# Patient Record
Sex: Female | Born: 1935 | Race: White | Hispanic: No | Marital: Single | Smoking: Never smoker
Health system: Southern US, Community
[De-identification: ages and names within clinical notes are randomized; demographics above are authoritative.]

## PROBLEM LIST (undated history)

## (undated) DIAGNOSIS — M1990 Unspecified osteoarthritis, unspecified site: Secondary | ICD-10-CM

## (undated) DIAGNOSIS — K635 Polyp of colon: Secondary | ICD-10-CM

## (undated) DIAGNOSIS — K219 Gastro-esophageal reflux disease without esophagitis: Secondary | ICD-10-CM

## (undated) HISTORY — PX: ABDOMINAL HYSTERECTOMY: SHX81

## (undated) HISTORY — DX: Polyp of colon: K63.5

## (undated) HISTORY — DX: Unspecified osteoarthritis, unspecified site: M19.90

## (undated) HISTORY — PX: TONSILLECTOMY: SUR1361

---

## 2012-10-09 ENCOUNTER — Encounter (HOSPITAL_BASED_OUTPATIENT_CLINIC_OR_DEPARTMENT_OTHER): Payer: Self-pay | Admitting: *Deleted

## 2012-10-09 ENCOUNTER — Emergency Department (HOSPITAL_BASED_OUTPATIENT_CLINIC_OR_DEPARTMENT_OTHER): Payer: Medicare Other

## 2012-10-09 ENCOUNTER — Emergency Department (HOSPITAL_BASED_OUTPATIENT_CLINIC_OR_DEPARTMENT_OTHER)
Admission: EM | Admit: 2012-10-09 | Discharge: 2012-10-09 | Disposition: A | Discharge status: Home / Self Care | Payer: Medicare Other | Attending: Emergency Medicine | Admitting: Emergency Medicine

## 2012-10-09 DIAGNOSIS — Y939 Activity, unspecified: Secondary | ICD-10-CM | POA: Insufficient documentation

## 2012-10-09 DIAGNOSIS — S62319A Displaced fracture of base of unspecified metacarpal bone, initial encounter for closed fracture: Secondary | ICD-10-CM | POA: Insufficient documentation

## 2012-10-09 DIAGNOSIS — S0990XA Unspecified injury of head, initial encounter: Secondary | ICD-10-CM

## 2012-10-09 DIAGNOSIS — Z23 Encounter for immunization: Secondary | ICD-10-CM | POA: Insufficient documentation

## 2012-10-09 DIAGNOSIS — Y9289 Other specified places as the place of occurrence of the external cause: Secondary | ICD-10-CM | POA: Insufficient documentation

## 2012-10-09 DIAGNOSIS — Z880 Allergy status to penicillin: Secondary | ICD-10-CM | POA: Insufficient documentation

## 2012-10-09 DIAGNOSIS — S8000XA Contusion of unspecified knee, initial encounter: Secondary | ICD-10-CM | POA: Insufficient documentation

## 2012-10-09 DIAGNOSIS — S62309A Unspecified fracture of unspecified metacarpal bone, initial encounter for closed fracture: Secondary | ICD-10-CM

## 2012-10-09 DIAGNOSIS — S022XXA Fracture of nasal bones, initial encounter for closed fracture: Secondary | ICD-10-CM

## 2012-10-09 DIAGNOSIS — S0120XA Unspecified open wound of nose, initial encounter: Secondary | ICD-10-CM | POA: Insufficient documentation

## 2012-10-09 DIAGNOSIS — S0121XA Laceration without foreign body of nose, initial encounter: Secondary | ICD-10-CM

## 2012-10-09 DIAGNOSIS — S8002XA Contusion of left knee, initial encounter: Secondary | ICD-10-CM

## 2012-10-09 DIAGNOSIS — W19XXXA Unspecified fall, initial encounter: Secondary | ICD-10-CM

## 2012-10-09 DIAGNOSIS — W010XXA Fall on same level from slipping, tripping and stumbling without subsequent striking against object, initial encounter: Secondary | ICD-10-CM | POA: Insufficient documentation

## 2012-10-09 HISTORY — DX: Gastro-esophageal reflux disease without esophagitis: K21.9

## 2012-10-09 MED ORDER — TRAMADOL HCL 50 MG PO TABS: 50.0000 mg | ORAL_TABLET | Freq: Four times a day (QID) | ORAL | Status: DC | PRN

## 2012-10-09 MED ORDER — ONDANSETRON 4 MG PO TBDP: 4.0000 mg | ORAL_TABLET | Freq: Once | ORAL | Status: AC

## 2012-10-09 MED ORDER — ONDANSETRON 4 MG PO TBDP
ORAL_TABLET | ORAL | Status: AC
  Administered 2012-10-09: 4 mg via ORAL
  Filled 2012-10-09: qty 1

## 2012-10-09 MED ORDER — TETANUS-DIPHTH-ACELL PERTUSSIS 5-2.5-18.5 LF-MCG/0.5 IM SUSP
0.5000 mL | Freq: Once | INTRAMUSCULAR | Status: AC
  Administered 2012-10-09: 0.5 mL via INTRAMUSCULAR
  Filled 2012-10-09: qty 0.5

## 2012-10-09 NOTE — ED Notes (Signed)
Patient states she tripped on cub and fell. Laceration to nose c/o R hand/wrist & R knee pain

## 2012-10-09 NOTE — ED Provider Notes (Signed)
Wound management per request of Dr. Bernette Mayers.  Pt has a small cut to bridge of nose, not deep enough for suture.  No obvious septal hematoma.  Wound were cleansed, clot powder apply for hemostasis, and sterile strip applied.  Pt tolerates well.  [name], PA-C 10/09/12 1300

## 2012-10-09 NOTE — ED Provider Notes (Signed)
Medical screening examination/treatment/procedure(s) were conducted as a shared visit with non-physician practitioner(s) and myself.  I personally evaluated the patient during the encounter   Charles B. Bernette Mayers, MD 10/09/12 1409

## 2012-10-09 NOTE — ED Provider Notes (Signed)
CSN: [id number]     Arrival date & time 10/09/12  1027 History     First MD Initiated Contact with Patient 10/09/12 1029     Chief Complaint  Patient presents with  . Fall   (Consider location/radiation/quality/duration/timing/severity/associated sxs/prior Treatment) Patient is a 79 y.o. female presenting with fall.  Fall   Pt reports she tripped and fell at the [employer] just prior to arrival, hitting her face. She denies LOC, complaining of moderate pain to nose where she sustained a laceration as well as R hand and L knee. Tetanus is unknown. Not taking any blood thinners.  No past medical history on file. No past surgical history on file. No family history on file. History  Substance Use Topics  . Smoking status: Not on file  . Smokeless tobacco: Not on file  . Alcohol Use: Not on file   OB History   No data available     Review of Systems All other systems reviewed and are negative except as noted in HPI.   Allergies  Penicillins  Home Medications   Current Outpatient Rx  Name  Route  Sig  Dispense  Refill  . Omeprazole (PRILOSEC PO)   Oral   Take by mouth.          There were no vitals taken for this visit. Physical Exam  Nursing note and vitals reviewed. Constitutional: She is oriented to person, place, and time. She appears well-developed and well-nourished.  HENT:  Head: Normocephalic.  Laceration to bridge of nose; abrasion to R forehead  Eyes: EOM are normal. Pupils are equal, round, and reactive to light.  Neck: Normal range of motion. Neck supple.  Cardiovascular: Normal rate, normal heart sounds and intact distal pulses.   Pulmonary/Chest: Effort normal and breath sounds normal.  Abdominal: Bowel sounds are normal. She exhibits no distension. There is no tenderness.  Musculoskeletal: Normal range of motion. She exhibits no edema. Tenderness: tender to R hand and L knee.  Neurological: She is alert and oriented to person, place, and time.  She has normal strength. No cranial nerve deficit or sensory deficit.  Skin: Skin is warm and dry. No rash noted.  abrasion to R hand and bilateral knee  Psychiatric: She has a normal mood and affect.    ED Course   Procedures (including critical care time)  Labs Reviewed - No data to display Ct Head Wo Contrast  10/09/2012   *RADIOLOGY REPORT*  Clinical Data:  Status post fall with facial injury.  Right supraorbital hematoma and nasal pain.  CT HEAD WITHOUT CONTRAST CT MAXILLOFACIAL WITHOUT CONTRAST  Technique:  Multidetector CT imaging of the head and maxillofacial structures were performed using the standard protocol without intravenous contrast. Multiplanar CT image reconstructions of the maxillofacial structures were also generated.  Comparison:   None.  CT HEAD  Findings: There is soft tissue swelling in the right frontal scalp. There is no evidence of acute intracranial hemorrhage, mass lesion, brain edema or extra-axial fluid collection.  The ventricles and subarachnoid spaces are appropriately sized for age.  There is no CT evidence of acute cortical infarction.  The visualized paranasal sinuses, mastoid air cells and middle ears are clear. Facial findings are described below.  There is no evidence of calvarial fracture.  IMPRESSION: No acute intracranial or calvarial findings.  CT MAXILLOFACIAL  Findings:   There are nondisplaced bilateral nasal bone fractures. No other facial fractures are identified.  The mandible and temporomandibular joints are intact.  The  paranasal sinuses are clear without air fluid levels.  No orbital hematoma is identified.  There is a focal supraorbital hematoma in the right frontal scalp.  Degenerative changes are noted within the upper cervical spine.  IMPRESSION:  1.  Bilateral nondisplaced nasal bone fractures. 2.  No other facial fractures are identified. 3.  Right supraorbital soft tissue swelling/hematoma.   Original Report Authenticated By: Carey Bullocks,  M.D.   Dg Knee Complete 4 Views Left  10/09/2012   *RADIOLOGY REPORT*  Clinical Data: Fall  LEFT KNEE - COMPLETE 4+ VIEW  Comparison: None.  Findings: No acute fracture and no dislocation.  IMPRESSION: No acute bony pathology.   Original Report Authenticated By: Jolaine Click, M.D.   Dg Hand Complete Right  10/09/2012   *RADIOLOGY REPORT*  Clinical Data: Injury  RIGHT HAND - COMPLETE 3+ VIEW  Comparison: None.  Findings: There is cortical step off along the lateral aspect of the base of the fifth metacarpal.  This can be seen on two views with lucency extending into the central bone.  Nondisplaced fracture is not excluded.  Digits of the small finger are intact. Degenerative changes.  Osteopenia.  IMPRESSION: Nondisplaced fracture at the base of the fifth metacarpal is suspected.  Correlate clinically.   Original Report Authenticated By: Jolaine Click, M.D.   Ct Maxillofacial Wo Cm  10/09/2012   *RADIOLOGY REPORT*  Clinical Data:  Status post fall with facial injury.  Right supraorbital hematoma and nasal pain.  CT HEAD WITHOUT CONTRAST CT MAXILLOFACIAL WITHOUT CONTRAST  Technique:  Multidetector CT imaging of the head and maxillofacial structures were performed using the standard protocol without intravenous contrast. Multiplanar CT image reconstructions of the maxillofacial structures were also generated.  Comparison:   None.  CT HEAD  Findings: There is soft tissue swelling in the right frontal scalp. There is no evidence of acute intracranial hemorrhage, mass lesion, brain edema or extra-axial fluid collection.  The ventricles and subarachnoid spaces are appropriately sized for age.  There is no CT evidence of acute cortical infarction.  The visualized paranasal sinuses, mastoid air cells and middle ears are clear. Facial findings are described below.  There is no evidence of calvarial fracture.  IMPRESSION: No acute intracranial or calvarial findings.  CT MAXILLOFACIAL  Findings:   There are nondisplaced  bilateral nasal bone fractures. No other facial fractures are identified.  The mandible and temporomandibular joints are intact.  The paranasal sinuses are clear without air fluid levels.  No orbital hematoma is identified.  There is a focal supraorbital hematoma in the right frontal scalp.  Degenerative changes are noted within the upper cervical spine.  IMPRESSION:  1.  Bilateral nondisplaced nasal bone fractures. 2.  No other facial fractures are identified. 3.  Right supraorbital soft tissue swelling/hematoma.   Original Report Authenticated By: Carey Bullocks, M.D.   1. Fall, initial encounter   2. Head injury, initial encounter   3. Nasal bone fracture, closed, initial encounter   4. Laceration of nose, initial encounter   5. Metacarpal bone fracture, closed, initial encounter   6. Contusion of knee, left, initial encounter     MDM  Imaging as above. Ulnar gutter for hand fx with Hand followup. Wound repair per [name], PAC. ENT follow up as needed for nasal bone fx.   Giabella Duhart B. Bernette Mayers, MD 10/09/12 1310

## 2012-10-09 NOTE — ED Notes (Signed)
Patient ambulatory to restroom without difficulty and with stand-by assist from family member. Patient with steady gait

## 2012-10-09 NOTE — ED Notes (Signed)
The patient is in a gown for X-Ray. There is a wet gauze on the patient's nose to help with the bleeding. The bed is locked the rails are up and it is in the lowest position. The call light is within reach.

## 2012-10-19 ENCOUNTER — Ambulatory Visit: Payer: Self-pay | Admitting: Family Medicine

## 2012-10-29 ENCOUNTER — Ambulatory Visit (INDEPENDENT_AMBULATORY_CARE_PROVIDER_SITE_OTHER): Payer: Medicare Other | Admitting: Family Medicine

## 2012-10-29 ENCOUNTER — Encounter: Payer: Self-pay | Admitting: Family Medicine

## 2012-10-29 VITALS — BP 121/78 | HR 83 | Resp 16 | Ht 63.5 in | Wt 184.0 lb

## 2012-10-29 DIAGNOSIS — K219 Gastro-esophageal reflux disease without esophagitis: Secondary | ICD-10-CM

## 2012-10-29 DIAGNOSIS — R109 Unspecified abdominal pain: Secondary | ICD-10-CM

## 2012-10-29 MED ORDER — OMEPRAZOLE 20 MG PO CPDR: DELAYED_RELEASE_CAPSULE | ORAL | Status: DC

## 2012-10-29 NOTE — Progress Notes (Signed)
  Subjective:    Patient ID: Leslie Reynolds, female    DOB: 07/06/1927, 79 y.o.   MRN: [id number]  HPI  Leslie Reynolds is here today to establish care with our practice.  She was referred to [place] by her friend Quinn Axe).  She moved to [place] from [place], [place] back in May.  She previously received her care from a Dr. Hollie Beach with [facility] in [place].  She would like to discuss the issues listed below:    1)  Abdominal Tenderness:  She has accidentally fallen a couple of times recently and was seen at the [facility] ED.  In July, she fractured her nose and her right hand at the base of the 5th metacarpal.  Since her fall, she has had some tenderness in her abdomen.   2) GERD:  Her acid reflux is under control on her Prilosec 40 mg.  She needs a refill on it.    Review of Systems  Constitutional: Negative for activity change, fatigue and unexpected weight change.  HENT: Negative.   Eyes: Negative.   Respiratory: Negative for shortness of breath.   Cardiovascular: Negative for chest pain, palpitations and leg swelling.  Gastrointestinal: Positive for abdominal pain. Negative for diarrhea and constipation.       Tenderness  Endocrine: Negative.   Genitourinary: Negative for difficulty urinating.  Musculoskeletal: Negative.   Skin: Negative.   Neurological: Negative.   Hematological: Negative for adenopathy. Does not bruise/bleed easily.  Psychiatric/Behavioral: Negative for sleep disturbance and dysphoric mood. The patient is not nervous/anxious.     Past Medical History  Diagnosis Date  . Acid reflux   . Arthritis     Cervical  and Lumbar Spine     Family History  Problem Relation Age of Onset  . Hypertension Mother   . Heart disease Mother   . Cancer Brother     History   Social History Narrative   Marital Status: Single    Children:  None   Pets: None    Living Situation: Lives alone    Occupation:  Retired Government social research officer)    Education: Engineer, maintenance (IT)    Tobacco Use/Exposure:  None    Alcohol Use:  Occasional   Drug Use:  None   Diet:  Regular   Exercise:  None   Hobbies:  Story telling and playing games                  Objective:   Physical Exam  Vitals reviewed. Constitutional: She is oriented to person, place, and time.  Eyes: Conjunctivae are normal. No scleral icterus.  Neck: Neck supple. No thyromegaly present.  Cardiovascular: Normal rate, regular rhythm and normal heart sounds.   Pulmonary/Chest: Effort normal and breath sounds normal.  Abdominal: Soft. Bowel sounds are normal. She exhibits no distension and no mass. There is tenderness (Mild). There is no rebound and no guarding.  Musculoskeletal: She exhibits no edema and no tenderness.  Lymphadenopathy:    She has no cervical adenopathy.  Neurological: She is alert and oriented to person, place, and time.  Skin: Skin is warm and dry.  Psychiatric: She has a normal mood and affect. Her behavior is normal. Judgment and thought content normal.     Assessment & Plan:

## 2012-12-12 ENCOUNTER — Encounter: Payer: Self-pay | Admitting: Family Medicine

## 2012-12-12 DIAGNOSIS — R109 Unspecified abdominal pain: Secondary | ICD-10-CM | POA: Insufficient documentation

## 2012-12-12 DIAGNOSIS — K219 Gastro-esophageal reflux disease without esophagitis: Secondary | ICD-10-CM | POA: Insufficient documentation

## 2012-12-12 NOTE — Assessment & Plan Note (Signed)
She appears to have just pulled a muscle.  If she does not improve, we'll investigate this further.

## 2012-12-12 NOTE — Assessment & Plan Note (Addendum)
She will continue on Prilosec and was given a prescription for [facility].  She will take 1-2 of the 20 mg capsules.

## 2013-03-03 ENCOUNTER — Encounter (INDEPENDENT_AMBULATORY_CARE_PROVIDER_SITE_OTHER): Payer: Self-pay

## 2013-03-03 ENCOUNTER — Ambulatory Visit (INDEPENDENT_AMBULATORY_CARE_PROVIDER_SITE_OTHER): Payer: Medicare Other | Admitting: Family Medicine

## 2013-03-03 ENCOUNTER — Ambulatory Visit: Payer: Self-pay | Admitting: Family Medicine

## 2013-03-03 ENCOUNTER — Encounter: Payer: Self-pay | Admitting: Family Medicine

## 2013-03-03 VITALS — BP 133/74 | HR 82 | Resp 16 | Wt 194.0 lb

## 2013-03-03 DIAGNOSIS — R2689 Other abnormalities of gait and mobility: Secondary | ICD-10-CM

## 2013-03-03 DIAGNOSIS — R29818 Other symptoms and signs involving the nervous system: Secondary | ICD-10-CM

## 2013-03-03 DIAGNOSIS — M549 Dorsalgia, unspecified: Secondary | ICD-10-CM

## 2013-03-03 MED ORDER — CYCLOBENZAPRINE HCL 5 MG PO TABS: 5.0000 mg | ORAL_TABLET | Freq: Every day | ORAL | Status: DC

## 2013-03-03 MED ORDER — MELOXICAM 7.5 MG PO TABS: 7.5000 mg | ORAL_TABLET | Freq: Every day | ORAL | Status: DC

## 2013-03-03 NOTE — Progress Notes (Signed)
   Subjective:    Patient ID: Leslie Reynolds, female    DOB: 04/24/1927, 79 y.o.   MRN: [id number]  HPI  [place] is here today complaining of back pain.  Since July, 2014, she has fallen a couple of times.  She feels that her falls are caused by her lack of attention.  She has a nurse friend who has evaluated her home for safety and she feels that Nasia's home is safe.  Her last fall was 10 days ago and since this fall she has been having severe back pain. This pain radiates to her left leg and she has experienced some numbness as well.  She has taken Ibuprofen (200 mg) two pills every two hours which has eased her pain.     Review of Systems  Musculoskeletal: Positive for back pain.  Neurological: Positive for numbness.       Left thigh    Past Medical History  Diagnosis Date  . Acid reflux   . Arthritis     Cervical  and Lumbar Spine      Past Surgical History  Procedure Laterality Date  . Abdominal hysterectomy    . Tonsillectomy       History   Social History Narrative   Marital Status: Single    Children:  None   Pets: None    Living Situation: Lives alone    Occupation:  Retired Government social research officer)    Education: Engineer, maintenance (IT)    Tobacco Use/Exposure:  None    Alcohol Use:  Occasional   Drug Use:  None   Diet:  Regular   Exercise:  None   Hobbies:  Story telling and playing games                 Family History  Problem Relation Age of Onset  . Hypertension Mother   . Heart disease Mother   . Cancer Brother      Current Outpatient Prescriptions on File Prior to Visit  Medication Sig Dispense Refill  . omeprazole (PRILOSEC) 20 MG capsule Take 1-2 capsules per day  180 capsule  4  . [DISCONTINUED] Omeprazole (PRILOSEC PO) Take by mouth.       No current facility-administered medications on file prior to visit.     Allergies  Allergen Reactions  . Penicillins      Immunization History  Administered Date(s) Administered  . Tdap  10/09/2012      Objective:   Physical Exam  Constitutional: She appears well-nourished. She appears distressed.  Neck: Normal range of motion. Neck supple.  Cardiovascular: Normal rate, regular rhythm and normal heart sounds.   Pulmonary/Chest: Effort normal and breath sounds normal.  Musculoskeletal: She exhibits tenderness (Low Back Pain). She exhibits no edema.       Lumbar back: She exhibits decreased range of motion, tenderness and spasm. She exhibits no edema and no deformity.  Neurological: She has normal reflexes. She exhibits normal muscle tone. Coordination normal.  Skin: No rash noted.      Assessment & Plan:    Jamaiya was seen today for back pain.  Diagnoses and associated orders for this visit:  Backache - meloxicam (MOBIC) 7.5 MG tablet; Take 1 tablet (7.5 mg total) by mouth daily. - cyclobenzaprine (FLEXERIL) 5 MG tablet; Take 1 tablet (5 mg total) by mouth at bedtime.   Balance disorder - Ambulatory referral to Physical Therapy

## 2013-04-08 ENCOUNTER — Encounter: Payer: Medicare Other | Admitting: Family Medicine

## 2013-05-12 ENCOUNTER — Encounter: Payer: Medicare Other | Admitting: Family Medicine

## 2013-06-24 ENCOUNTER — Encounter: Payer: Self-pay | Admitting: Family Medicine

## 2013-06-24 ENCOUNTER — Ambulatory Visit (INDEPENDENT_AMBULATORY_CARE_PROVIDER_SITE_OTHER): Payer: Medicare Other | Admitting: Family Medicine

## 2013-06-24 VITALS — BP 137/74 | HR 96 | Resp 16 | Ht 63.5 in | Wt 183.0 lb

## 2013-06-24 DIAGNOSIS — K219 Gastro-esophageal reflux disease without esophagitis: Secondary | ICD-10-CM

## 2013-06-24 DIAGNOSIS — R5381 Other malaise: Secondary | ICD-10-CM

## 2013-06-24 DIAGNOSIS — Z0000 Encounter for general adult medical examination without abnormal findings: Secondary | ICD-10-CM

## 2013-06-24 DIAGNOSIS — R5383 Other fatigue: Secondary | ICD-10-CM

## 2013-06-24 DIAGNOSIS — R319 Hematuria, unspecified: Secondary | ICD-10-CM

## 2013-06-24 LAB — POCT URINALYSIS DIPSTICK
Bilirubin, UA: NEGATIVE
Glucose, UA: NEGATIVE
Ketones, UA: NEGATIVE
Leukocytes, UA: NEGATIVE
Nitrite, UA: NEGATIVE
Protein, UA: NEGATIVE
Spec Grav, UA: 1.015
Urobilinogen, UA: NEGATIVE
pH, UA: 6

## 2013-06-24 LAB — EKG 12-LEAD

## 2013-06-24 NOTE — Patient Instructions (Signed)
1)  Preventative Issues:    A) Confirm your vaccinations:   Tdap Pneumovax Prevnar 13 (New Pneumonia)  Zostavax   B)  I would recommend a colonoscopy 5 years from your last one.  You might also consider doing the stool cards in between your colonoscopy.  C)  Mammogram - Consider getting a digital mammogram (Ask your buddies where do they go).  Take Ibuprofen 800 mg and Tylenol 1000 mg prior to the mammogram.      Preventive Care for Adults, Female A healthy lifestyle and preventive care can promote health and wellness. Preventive health guidelines for women include the following key practices.  A routine yearly physical is a good way to check with your health care provider about your health and preventive screening. It is a chance to share any concerns and updates on your health and to receive a thorough exam.  Visit your dentist for a routine exam and preventive care every 6 months. Brush your teeth twice a day and floss once a day. Good oral hygiene prevents tooth decay and gum disease.  The frequency of eye exams is based on your age, health, family medical history, use of contact lenses, and other factors. Follow your health care provider's recommendations for frequency of eye exams.  Eat a healthy diet. Foods like vegetables, fruits, whole grains, low-fat dairy products, and lean protein foods contain the nutrients you need without too many calories. Decrease your intake of foods high in solid fats, added sugars, and salt. Eat the right amount of calories for you.Get information about a proper diet from your health care provider, if necessary.  Regular physical exercise is one of the most important things you can do for your health. Most adults should get at least 150 minutes of moderate-intensity exercise (any activity that increases your heart rate and causes you to sweat) each week. In addition, most adults need muscle-strengthening exercises on 2 or more days a week.  Maintain a  healthy weight. The body mass index (BMI) is a screening tool to identify possible weight problems. It provides an estimate of body fat based on height and weight. Your health care provider can find your BMI, and can help you achieve or maintain a healthy weight.For adults 20 years and older:  A BMI below 18.5 is considered underweight.  A BMI of 18.5 to 24.9 is normal.  A BMI of 25 to 29.9 is considered overweight.  A BMI of 30 and above is considered obese.  Maintain normal blood lipids and cholesterol levels by exercising and minimizing your intake of saturated fat. Eat a balanced diet with plenty of fruit and vegetables. Blood tests for lipids and cholesterol should begin at age 78 and be repeated every 5 years. If your lipid or cholesterol levels are high, you are over 50, or you are at high risk for heart disease, you may need your cholesterol levels checked more frequently.Ongoing high lipid and cholesterol levels should be treated with medicines if diet and exercise are not working.  If you smoke, find out from your health care provider how to quit. If you do not use tobacco, do not start.  Lung cancer screening is recommended for adults aged 14 80 years who are at high risk for developing lung cancer because of a history of smoking. A yearly low-dose CT scan of the lungs is recommended for people who have at least a 30-pack-year history of smoking and are a current smoker or have quit within the past 15  years. A pack year of smoking is smoking an average of 1 pack of cigarettes a day for 1 year (for example: 1 pack a day for 30 years or 2 packs a day for 15 years). Yearly screening should continue until the smoker has stopped smoking for at least 15 years. Yearly screening should be stopped for people who develop a health problem that would prevent them from having lung cancer treatment.  If you are pregnant, do not drink alcohol. If you are breastfeeding, be very cautious about drinking  alcohol. If you are not pregnant and choose to drink alcohol, do not have more than 1 drink per day. One drink is considered to be 12 ounces (355 mL) of beer, 5 ounces (148 mL) of wine, or 1.5 ounces (44 mL) of liquor.  Avoid use of street drugs. Do not share needles with anyone. Ask for help if you need support or instructions about stopping the use of drugs.  High blood pressure causes heart disease and increases the risk of stroke. Your blood pressure should be checked at least every 1 to 2 years. Ongoing high blood pressure should be treated with medicines if weight loss and exercise do not work.  If you are 94 78 years old, ask your health care provider if you should take aspirin to prevent strokes.  Diabetes screening involves taking a blood sample to check your fasting blood sugar level. This should be done once every 3 years, after age 96, if you are within normal weight and without risk factors for diabetes. Testing should be considered at a younger age or be carried out more frequently if you are overweight and have at least 1 risk factor for diabetes.  Breast cancer screening is essential preventive care for women. You should practice "breast self-awareness." This means understanding the normal appearance and feel of your breasts and may include breast self-examination. Any changes detected, no matter how small, should be reported to a health care provider. Women in their 72s and 30s should have a clinical breast exam (CBE) by a health care provider as part of a regular health exam every 1 to 3 years. After age 66, women should have a CBE every year. Starting at age 60, women should consider having a mammogram (breast X-ray test) every year. Women who have a family history of breast cancer should talk to their health care provider about genetic screening. Women at a high risk of breast cancer should talk to their health care providers about having an MRI and a mammogram every year.  Breast  cancer gene (BRCA)-related cancer risk assessment is recommended for women who have family members with BRCA-related cancers. BRCA-related cancers include breast, ovarian, tubal, and peritoneal cancers. Having family members with these cancers may be associated with an increased risk for harmful changes (mutations) in the breast cancer genes BRCA1 and BRCA2. Results of the assessment will determine the need for genetic counseling and BRCA1 and BRCA2 testing.  The Pap test is a screening test for cervical cancer. A Pap test can show cell changes on the cervix that might become cervical cancer if left untreated. A Pap test is a procedure in which cells are obtained and examined from the lower end of the uterus (cervix).  Women should have a Pap test starting at age 28.  Between ages 45 and 46, Pap tests should be repeated every 2 years.  Beginning at age 40, you should have a Pap test every 3 years as long as the  past 3 Pap tests have been normal.  Some women have medical problems that increase the chance of getting cervical cancer. Talk to your health care provider about these problems. It is especially important to talk to your health care provider if a new problem develops soon after your last Pap test. In these cases, your health care provider may recommend more frequent screening and Pap tests.  The above recommendations are the same for women who have or have not gotten the vaccine for human papillomavirus (HPV).  If you had a hysterectomy for a problem that was not cancer or a condition that could lead to cancer, then you no longer need Pap tests. Even if you no longer need a Pap test, a regular exam is a good idea to make sure no other problems are starting.  If you are between ages 4 and 22 years, and you have had normal Pap tests going back 10 years, you no longer need Pap tests. Even if you no longer need a Pap test, a regular exam is a good idea to make sure no other problems are  starting.  If you have had past treatment for cervical cancer or a condition that could lead to cancer, you need Pap tests and screening for cancer for at least 20 years after your treatment.  If Pap tests have been discontinued, risk factors (such as a new sexual partner) need to be reassessed to determine if screening should be resumed.  The HPV test is an additional test that may be used for cervical cancer screening. The HPV test looks for the virus that can cause the cell changes on the cervix. The cells collected during the Pap test can be tested for HPV. The HPV test could be used to screen women aged 45 years and older, and should be used in women of any age who have unclear Pap test results. After the age of 58, women should have HPV testing at the same frequency as a Pap test.  Colorectal cancer can be detected and often prevented. Most routine colorectal cancer screening begins at the age of 46 years and continues through age 67 years. However, your health care provider may recommend screening at an earlier age if you have risk factors for colon cancer. On a yearly basis, your health care provider may provide home test kits to check for hidden blood in the stool. Use of a small camera at the end of a tube, to directly examine the colon (sigmoidoscopy or colonoscopy), can detect the earliest forms of colorectal cancer. Talk to your health care provider about this at age 68, when routine screening begins. Direct exam of the colon should be repeated every 5 10 years through age 75 years, unless early forms of pre-cancerous polyps or small growths are found.  People who are at an increased risk for hepatitis B should be screened for this virus. You are considered at high risk for hepatitis B if:  You were born in a country where hepatitis B occurs often. Talk with your health care provider about which countries are considered high risk.  Your parents were born in a high-risk country and you  have not received a shot to protect against hepatitis B (hepatitis B vaccine).  You have HIV or AIDS.  You use needles to inject street drugs.  You live with, or have sex with, someone who has Hepatitis B.  You get hemodialysis treatment.  You take certain medicines for conditions like cancer, organ transplantation,  and autoimmune conditions.  Hepatitis C blood testing is recommended for all people born from [birth year] through 1963 and any individual with known risks for hepatitis C.  Practice safe sex. Use condoms and avoid high-risk sexual practices to reduce the spread of sexually transmitted infections (STIs). STIs include gonorrhea, chlamydia, syphilis, trichomonas, herpes, HPV, and human immunodeficiency virus (HIV). Herpes, HIV, and HPV are viral illnesses that have no cure. They can result in disability, cancer, and death. Sexually active women aged 17 years and younger should be checked for chlamydia. Older women with new or multiple partners should also be tested for chlamydia. Testing for other STIs is recommended if you are sexually active and at increased risk.  Osteoporosis is a disease in which the bones lose minerals and strength with aging. This can result in serious bone fractures or breaks. The risk of osteoporosis can be identified using a bone density scan. Women ages 30 years and over and women at risk for fractures or osteoporosis should discuss screening with their health care providers. Ask your health care provider whether you should take a calcium supplement or vitamin D to reduce the rate of osteoporosis.  Menopause can be associated with physical symptoms and risks. Hormone replacement therapy is available to decrease symptoms and risks. You should talk to your health care provider about whether hormone replacement therapy is right for you.  Use sunscreen. Apply sunscreen liberally and repeatedly throughout the day. You should seek shade when your shadow is shorter than  you. Protect yourself by wearing long sleeves, pants, a wide-brimmed hat, and sunglasses year round, whenever you are outdoors.  Once a month, do a whole body skin exam, using a mirror to look at the skin on your back. Tell your health care provider of new moles, moles that have irregular borders, moles that are larger than a pencil eraser, or moles that have changed in shape or color.  Stay current with required vaccines (immunizations).  Influenza vaccine. All adults should be immunized every year.  Tetanus, diphtheria, and acellular pertussis (Td, Tdap) vaccine. Pregnant women should receive 1 dose of Tdap vaccine during each pregnancy. The dose should be obtained regardless of the length of time since the last dose. Immunization is preferred during the 27th 36th week of gestation. An adult who has not previously received Tdap or who does not know her vaccine status should receive 1 dose of Tdap. This initial dose should be followed by tetanus and diphtheria toxoids (Td) booster doses every 10 years. Adults with an unknown or incomplete history of completing a 3-dose immunization series with Td-containing vaccines should begin or complete a primary immunization series including a Tdap dose. Adults should receive a Td booster every 10 years.  Varicella vaccine. An adult without evidence of immunity to varicella should receive 2 doses or a second dose if she has previously received 1 dose. Pregnant females who do not have evidence of immunity should receive the first dose after pregnancy. This first dose should be obtained before leaving the health care facility. The second dose should be obtained 4 8 weeks after the first dose.  Human papillomavirus (HPV) vaccine. Females aged 51 26 years who have not received the vaccine previously should obtain the 3-dose series. The vaccine is not recommended for use in pregnant females. However, pregnancy testing is not needed before receiving a dose. If a female  is found to be pregnant after receiving a dose, no treatment is needed. In that case, the remaining doses should  be delayed until after the pregnancy. Immunization is recommended for any person with an immunocompromised condition through the age of 58 years if she did not get any or all doses earlier. During the 3-dose series, the second dose should be obtained 4 8 weeks after the first dose. The third dose should be obtained 24 weeks after the first dose and 16 weeks after the second dose.  Zoster vaccine. One dose is recommended for adults aged 19 years or older unless certain conditions are present.  Measles, mumps, and rubella (MMR) vaccine. Adults born before [birth year] generally are considered immune to measles and mumps. Adults born in [birth year] or later should have 1 or more doses of MMR vaccine unless there is a contraindication to the vaccine or there is laboratory evidence of immunity to each of the three diseases. A routine second dose of MMR vaccine should be obtained at least 28 days after the first dose for students attending postsecondary schools, health care workers, or international travelers. People who received inactivated measles vaccine or an unknown type of measles vaccine during 1963 1967 should receive 2 doses of MMR vaccine. People who received inactivated mumps vaccine or an unknown type of mumps vaccine before 1979 and are at high risk for mumps infection should consider immunization with 2 doses of MMR vaccine. For females of childbearing age, rubella immunity should be determined. If there is no evidence of immunity, females who are not pregnant should be vaccinated. If there is no evidence of immunity, females who are pregnant should delay immunization until after pregnancy. Unvaccinated health care workers born before [birth year] who lack laboratory evidence of measles, mumps, or rubella immunity or laboratory confirmation of disease should consider measles and mumps immunization with 2 doses of  MMR vaccine or rubella immunization with 1 dose of MMR vaccine.  Pneumococcal 13-valent conjugate (PCV13) vaccine. When indicated, a person who is uncertain of her immunization history and has no record of immunization should receive the PCV13 vaccine. An adult aged 13 years or older who has certain medical conditions and has not been previously immunized should receive 1 dose of PCV13 vaccine. This PCV13 should be followed with a dose of pneumococcal polysaccharide (PPSV23) vaccine. The PPSV23 vaccine dose should be obtained at least 8 weeks after the dose of PCV13 vaccine. An adult aged 90 years or older who has certain medical conditions and previously received 1 or more doses of PPSV23 vaccine should receive 1 dose of PCV13. The PCV13 vaccine dose should be obtained 1 or more years after the last PPSV23 vaccine dose.  Pneumococcal polysaccharide (PPSV23) vaccine. When PCV13 is also indicated, PCV13 should be obtained first. All adults aged 80 years and older should be immunized. An adult younger than age 67 years who has certain medical conditions should be immunized. Any person who resides in a nursing home or long-term care facility should be immunized. An adult smoker should be immunized. People with an immunocompromised condition and certain other conditions should receive both PCV13 and PPSV23 vaccines. People with human immunodeficiency virus (HIV) infection should be immunized as soon as possible after diagnosis. Immunization during chemotherapy or radiation therapy should be avoided. Routine use of PPSV23 vaccine is not recommended for American Indians, [place] Natives, or people younger than 65 years unless there are medical conditions that require PPSV23 vaccine. When indicated, people who have unknown immunization and have no record of immunization should receive PPSV23 vaccine. One-time revaccination 5 years after the first dose of PPSV23 is recommended for  people aged 68 64 years who have chronic  kidney failure, nephrotic syndrome, asplenia, or immunocompromised conditions. People who received 1 2 doses of PPSV23 before age 79 years should receive another dose of PPSV23 vaccine at age 26 years or later if at least 5 years have passed since the previous dose. Doses of PPSV23 are not needed for people immunized with PPSV23 at or after age 22 years.  Meningococcal vaccine. Adults with asplenia or persistent complement component deficiencies should receive 2 doses of quadrivalent meningococcal conjugate (MenACWY-D) vaccine. The doses should be obtained at least 2 months apart. Microbiologists working with certain meningococcal bacteria, [employer], people at risk during an outbreak, and people who travel to or live in countries with a high rate of meningitis should be immunized. A first-year college student up through age 36 years who is living in a residence hall should receive a dose if she did not receive a dose on or after her 16th birthday. Adults who have certain high-risk conditions should receive one or more doses of vaccine.  Hepatitis A vaccine. Adults who wish to be protected from this disease, have certain high-risk conditions, work with hepatitis A-infected animals, work in hepatitis A research labs, or travel to or work in countries with a high rate of hepatitis A should be immunized. Adults who were previously unvaccinated and who anticipate close contact with an international adoptee during the first 60 days after arrival in the [place] from a country with a high rate of hepatitis A should be immunized.  Hepatitis B vaccine. Adults who wish to be protected from this disease, have certain high-risk conditions, may be exposed to blood or other infectious body fluids, are household contacts or sex partners of hepatitis B positive people, are clients or workers in certain care facilities, or travel to or work in countries with a high rate of hepatitis B should be  immunized.  Haemophilus influenzae type b (Hib) vaccine. A previously unvaccinated person with asplenia or sickle cell disease or having a scheduled splenectomy should receive 1 dose of Hib vaccine. Regardless of previous immunization, a recipient of a hematopoietic stem cell transplant should receive a 3-dose series 6 12 months after her successful transplant. Hib vaccine is not recommended for adults with HIV infection. Preventive Services / Frequency Ages 55 to 39years  Blood pressure check.** / Every 1 to 2 years.  Lipid and cholesterol check.** / Every 5 years beginning at age 27.  Clinical breast exam.** / Every 3 years for women in their 19s and 19s.  BRCA-related cancer risk assessment.** / For women who have family members with a BRCA-related cancer (breast, ovarian, tubal, or peritoneal cancers).  Pap test.** / Every 2 years from ages 63 through 46. Every 3 years starting at age 79 through age 40 or 6 with a history of 3 consecutive normal Pap tests.  HPV screening.** / Every 3 years from ages 31 through ages 4 to 72 with a history of 3 consecutive normal Pap tests.  Hepatitis C blood test.** / For any individual with known risks for hepatitis C.  Skin self-exam. / Monthly.  Influenza vaccine. / Every year.  Tetanus, diphtheria, and acellular pertussis (Tdap, Td) vaccine.** / Consult your health care provider. Pregnant women should receive 1 dose of Tdap vaccine during each pregnancy. 1 dose of Td every 10 years.  Varicella vaccine.** / Consult your health care provider. Pregnant females who do not have evidence of immunity should receive the first dose after pregnancy.  HPV vaccine. / 3 doses over 6 months, if 4 and younger. The vaccine is not recommended for use in pregnant females. However, pregnancy testing is not needed before receiving a dose.  Measles, mumps, rubella (MMR) vaccine.** / You need at least 1 dose of MMR if you were born in 1957 or later. You may also  need a 2nd dose. For females of childbearing age, rubella immunity should be determined. If there is no evidence of immunity, females who are not pregnant should be vaccinated. If there is no evidence of immunity, females who are pregnant should delay immunization until after pregnancy.  Pneumococcal 13-valent conjugate (PCV13) vaccine.** / Consult your health care provider.  Pneumococcal polysaccharide (PPSV23) vaccine.** / 1 to 2 doses if you smoke cigarettes or if you have certain conditions.  Meningococcal vaccine.** / 1 dose if you are age 31 to 76 years and a Market researcher living in a residence hall, or have one of several medical conditions, you need to get vaccinated against meningococcal disease. You may also need additional booster doses.  Hepatitis A vaccine.** / Consult your health care provider.  Hepatitis B vaccine.** / Consult your health care provider.  Haemophilus influenzae type b (Hib) vaccine.** / Consult your health care provider. Ages 37 to 64years  Blood pressure check.** / Every 1 to 2 years.  Lipid and cholesterol check.** / Every 5 years beginning at age 46 years.  Lung cancer screening. / Every year if you are aged 78 80 years and have a 30-pack-year history of smoking and currently smoke or have quit within the past 15 years. Yearly screening is stopped once you have quit smoking for at least 15 years or develop a health problem that would prevent you from having lung cancer treatment.  Clinical breast exam.** / Every year after age 63 years.  BRCA-related cancer risk assessment.** / For women who have family members with a BRCA-related cancer (breast, ovarian, tubal, or peritoneal cancers).  Mammogram.** / Every year beginning at age 98 years and continuing for as long as you are in good health. Consult with your health care provider.  Pap test.** / Every 3 years starting at age 65 years through age 41 or 34 years with a history of 3 consecutive  normal Pap tests.  HPV screening.** / Every 3 years from ages 83 years through ages 68 to 59 years with a history of 3 consecutive normal Pap tests.  Fecal occult blood test (FOBT) of stool. / Every year beginning at age 20 years and continuing until age 29 years. You may not need to do this test if you get a colonoscopy every 10 years.  Flexible sigmoidoscopy or colonoscopy.** / Every 5 years for a flexible sigmoidoscopy or every 10 years for a colonoscopy beginning at age 45 years and continuing until age 62 years.  Hepatitis C blood test.** / For all people born from [birth year] through 1963 and any individual with known risks for hepatitis C.  Skin self-exam. / Monthly.  Influenza vaccine. / Every year.  Tetanus, diphtheria, and acellular pertussis (Tdap/Td) vaccine.** / Consult your health care provider. Pregnant women should receive 1 dose of Tdap vaccine during each pregnancy. 1 dose of Td every 10 years.  Varicella vaccine.** / Consult your health care provider. Pregnant females who do not have evidence of immunity should receive the first dose after pregnancy.  Zoster vaccine.** / 1 dose for adults aged 28 years or older.  Measles, mumps, rubella (MMR) vaccine.** / You  need at least 1 dose of MMR if you were born in 1957 or later. You may also need a 2nd dose. For females of childbearing age, rubella immunity should be determined. If there is no evidence of immunity, females who are not pregnant should be vaccinated. If there is no evidence of immunity, females who are pregnant should delay immunization until after pregnancy.  Pneumococcal 13-valent conjugate (PCV13) vaccine.** / Consult your health care provider.  Pneumococcal polysaccharide (PPSV23) vaccine.** / 1 to 2 doses if you smoke cigarettes or if you have certain conditions.  Meningococcal vaccine.** / Consult your health care provider.  Hepatitis A vaccine.** / Consult your health care provider.  Hepatitis B vaccine.** /  Consult your health care provider.  Haemophilus influenzae type b (Hib) vaccine.** / Consult your health care provider. Ages 58 years and over  Blood pressure check.** / Every 1 to 2 years.  Lipid and cholesterol check.** / Every 5 years beginning at age 83 years.  Lung cancer screening. / Every year if you are aged 75 80 years and have a 30-pack-year history of smoking and currently smoke or have quit within the past 15 years. Yearly screening is stopped once you have quit smoking for at least 15 years or develop a health problem that would prevent you from having lung cancer treatment.  Clinical breast exam.** / Every year after age 30 years.  BRCA-related cancer risk assessment.** / For women who have family members with a BRCA-related cancer (breast, ovarian, tubal, or peritoneal cancers).  Mammogram.** / Every year beginning at age 74 years and continuing for as long as you are in good health. Consult with your health care provider.  Pap test.** / Every 3 years starting at age 71 years through age 68 or 74 years with 3 consecutive normal Pap tests. Testing can be stopped between 65 and 70 years with 3 consecutive normal Pap tests and no abnormal Pap or HPV tests in the past 10 years.  HPV screening.** / Every 3 years from ages 7 years through ages 64 or 2 years with a history of 3 consecutive normal Pap tests. Testing can be stopped between 65 and 70 years with 3 consecutive normal Pap tests and no abnormal Pap or HPV tests in the past 10 years.  Fecal occult blood test (FOBT) of stool. / Every year beginning at age 23 years and continuing until age 81 years. You may not need to do this test if you get a colonoscopy every 10 years.  Flexible sigmoidoscopy or colonoscopy.** / Every 5 years for a flexible sigmoidoscopy or every 10 years for a colonoscopy beginning at age 102 years and continuing until age 42 years.  Hepatitis C blood test.** / For all people born from [birth year] through 1963  and any individual with known risks for hepatitis C.  Osteoporosis screening.** / A one-time screening for women ages 82 years and over and women at risk for fractures or osteoporosis.  Skin self-exam. / Monthly.  Influenza vaccine. / Every year.  Tetanus, diphtheria, and acellular pertussis (Tdap/Td) vaccine.** / 1 dose of Td every 10 years.  Varicella vaccine.** / Consult your health care provider.  Zoster vaccine.** / 1 dose for adults aged 74 years or older.  Pneumococcal 13-valent conjugate (PCV13) vaccine.** / Consult your health care provider.  Pneumococcal polysaccharide (PPSV23) vaccine.** / 1 dose for all adults aged 5 years and older.  Meningococcal vaccine.** / Consult your health care provider.  Hepatitis A vaccine.** / Consult your  health care provider.  Hepatitis B vaccine.** / Consult your health care provider.  Haemophilus influenzae type b (Hib) vaccine.** / Consult your health care provider. ** Family history and personal history of risk and conditions may change your health care provider's recommendations. Document Released: 04/29/2001 Document Revised: 12/22/2012 Document Reviewed: 07/29/2010 [facility] Patient Information 2014 [place], [place].

## 2013-06-24 NOTE — Progress Notes (Signed)
Subjective:    Patient ID: Leslie Reynolds, female    DOB: Aug 06, 1927, 80 y.o.   MRN: [id number]  HPI  Leslie Reynolds is here today for her annual CPE. She has been having some diarrhea for the past several days.    Review of Systems  Constitutional: Positive for fatigue. Negative for activity change, appetite change and unexpected weight change.  HENT: Negative for congestion, dental problem, ear pain, hearing loss, trouble swallowing and voice change.   Eyes: Negative for pain, redness and visual disturbance.  Respiratory: Negative for cough and shortness of breath.   Cardiovascular: Negative for chest pain, palpitations and leg swelling.  Gastrointestinal: Positive for diarrhea. Negative for nausea, vomiting, abdominal pain, constipation and blood in stool.  Endocrine: Negative for cold intolerance, heat intolerance, polydipsia, polyphagia and polyuria.  Genitourinary: Negative for dysuria, urgency, frequency, hematuria, vaginal discharge and pelvic pain.  Musculoskeletal: Negative for arthralgias, back pain, joint swelling, myalgias and neck pain.  Skin: Negative for rash.  Neurological: Negative for dizziness, weakness and headaches.  Hematological: Negative for adenopathy. Does not bruise/bleed easily.  Psychiatric/Behavioral: Negative for behavioral problems, sleep disturbance, dysphoric mood and decreased concentration. The patient is not nervous/anxious.   All other systems reviewed and are negative.    Past Medical History  Diagnosis Date  . Acid reflux   . Arthritis     Cervical  and Lumbar Spine   . Colon polyp      Past Surgical History  Procedure Laterality Date  . Tonsillectomy    . Abdominal hysterectomy      Excessive Bleeding/Fibroid     History   Social History Narrative   Marital Status: Single    Children:  None   Pets: None    Living Situation: Lives alone    Occupation:  Retired Scientist, physiological)    Education: Statistician    Tobacco Use/Exposure:  None    Alcohol Use:  Occasional   Drug Use:  None   Diet:  Regular   Exercise:  None   Hobbies:  Story telling and playing games                    Family History  Problem Relation Age of Onset  . Hypertension Mother   . Heart disease Mother   . Mental illness Brother     One brother had Bi-Polar and he overdosed.    . Cancer Sister 30    Colon      Current Outpatient Prescriptions on File Prior to Visit  Medication Sig Dispense Refill  . [DISCONTINUED] Omeprazole (PRILOSEC PO) Take by mouth.       No current facility-administered medications on file prior to visit.     Allergies  Allergen Reactions  . Penicillins      Immunization History  Administered Date(s) Administered  . Tdap 10/09/2012       Objective:   Physical Exam  Nursing note and vitals reviewed. Constitutional: She is oriented to person, place, and time. She appears well-developed and well-nourished.  HENT:  Head: Normocephalic and atraumatic.  Right Ear: External ear normal.  Left Ear: External ear normal.  Nose: Nose normal.  Mouth/Throat: Oropharynx is clear and moist.  Eyes: Conjunctivae and EOM are normal. Pupils are equal, round, and reactive to light.  Neck: Normal range of motion. No thyromegaly present.  Cardiovascular: Normal rate, regular rhythm, normal heart sounds and intact distal pulses.  Exam reveals no gallop and no friction rub.  No murmur heard. Pulmonary/Chest: Effort normal and breath sounds normal. Right breast exhibits no inverted nipple, no mass, no nipple discharge, no skin change and no tenderness. Left breast exhibits no inverted nipple, no mass, no nipple discharge, no skin change and no tenderness. Breasts are symmetrical.  Abdominal: Soft. Bowel sounds are normal. Hernia confirmed negative in the right inguinal area and confirmed negative in the left inguinal area.  Genitourinary: Rectum normal and vagina normal. Pelvic exam  was performed with patient supine. There is no rash, tenderness or lesion on the right labia. There is no rash, tenderness or lesion on the left labia. No vaginal discharge found.  Musculoskeletal: Normal range of motion. She exhibits no edema and no tenderness.  Lymphadenopathy:    She has no cervical adenopathy.       Right: No inguinal adenopathy present.       Left: No inguinal adenopathy present.  Neurological: She is alert and oriented to person, place, and time. She has normal reflexes.  Skin: Skin is warm and dry.  Psychiatric: She has a normal mood and affect. Her behavior is normal. Judgment and thought content normal.      Assessment & Plan:    Leslie Reynolds was seen today for annual exam.  Preventative Issues:    A) Confirm your vaccinations:   Tdap Pneumovax Prevnar 13 (New Pneumonia)  Zostavax   B)  I would recommend a colonoscopy 5 years from your last one.  You might also consider doing the stool cards in between your colonoscopy.  C)  Mammogram - Consider getting a digital mammogram (Ask your buddies where do they go).  Take Ibuprofen 800 mg and Tylenol 1000 mg prior to the mammogram.     Diagnoses and associated orders for this visit:  Routine general medical examination at a health care facility Comments: We discussed preventative issues for her age.  She was encouraged to get vaccinations and is to check into her colonoscopy. - POCT urinalysis dipstick (Blood +) so we sent off a microscopic urine.   Other malaise and fatigue - EKG 12-Lead  Hematuria - Urinalysis, microscopic only (Negative)  Gastroesophageal reflux disease without esophagitis

## 2013-06-28 NOTE — ED Provider Notes (Signed)
Order(s) created erroneously. Erroneous order ID: [id number] Order moved by: MOORE, SUZANNE H Order move date/time: 06/28/2013  3:59 PM Source Patient:   [id number] Source Contact: 06/24/2013 Destination Patient:   [id number] Destination Contact: 06/01/2012

## 2013-06-28 NOTE — ED Provider Notes (Signed)
Order(s) created erroneously. Erroneous order ID: [id number] Order moved by: MOORE, SUZANNE H Order move date/time: 06/28/2013  4:00 PM Source Patient:   [id number] Source Contact: 06/27/2013 Destination Patient:   [id number] Destination Contact: 06/01/2012

## 2013-06-30 LAB — URINALYSIS, MICROSCOPIC ONLY
Bacteria, UA: NONE SEEN
Casts: NONE SEEN
Crystals: NONE SEEN
Squamous Epithelial / HPF: NONE SEEN

## 2013-07-01 ENCOUNTER — Telehealth: Payer: Self-pay | Admitting: *Deleted

## 2013-07-01 NOTE — Telephone Encounter (Signed)
I spoke with Leslie Reynolds and let her know that her urine did not show any blood in it.-eh

## 2013-09-04 DIAGNOSIS — R5383 Other fatigue: Secondary | ICD-10-CM

## 2013-09-04 DIAGNOSIS — R319 Hematuria, unspecified: Secondary | ICD-10-CM | POA: Insufficient documentation

## 2013-09-04 DIAGNOSIS — Z0000 Encounter for general adult medical examination without abnormal findings: Secondary | ICD-10-CM | POA: Insufficient documentation

## 2013-09-04 DIAGNOSIS — R5381 Other malaise: Secondary | ICD-10-CM | POA: Insufficient documentation

## 2013-09-05 LAB — HM MAMMOGRAPHY

## 2013-09-06 ENCOUNTER — Encounter: Payer: Self-pay | Admitting: *Deleted

## 2014-12-25 IMAGING — CT CT HEAD W/O CM
1 series · 15 of 30 positions shown, 19 images · non-contrast
Comparison: None.

CT HEAD

CLINICAL DATA: Status post fall with facial injury.  Right
supraorbital hematoma and nasal pain.

CT HEAD WITHOUT CONTRAST
CT MAXILLOFACIAL WITHOUT CONTRAST
TECHNIQUE: Multidetector CT imaging of the head and maxillofacial
structures were performed using the standard protocol without
intravenous contrast. Multiplanar CT image reconstructions of the
maxillofacial structures were also generated.

[Series 2: head 4.8 h37s · axial · 0.47mm/px · z∈[-155,-18]mm · 15 of 32 slices shown, 19 images]
[im 2/32  brain]
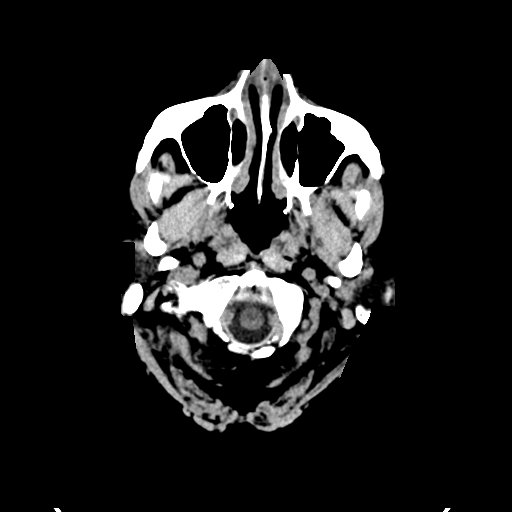
[im 2/32  bone]
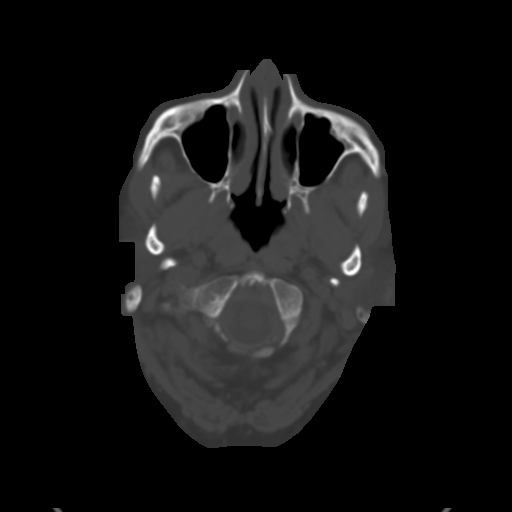
[im 4/32  brain]
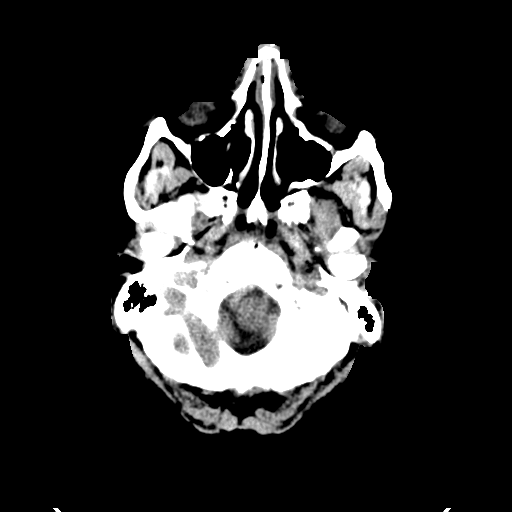
[im 6/32  brain]
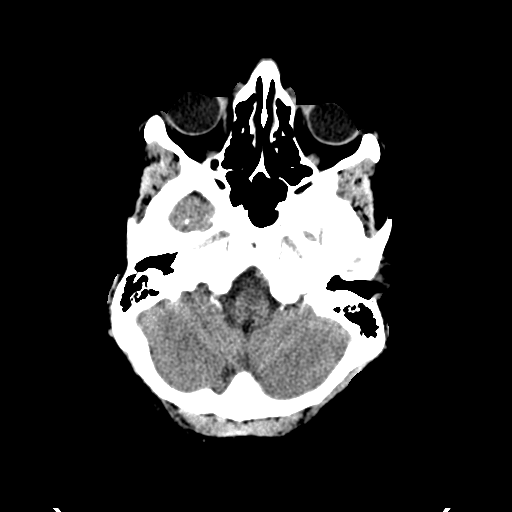
[im 8/32  brain]
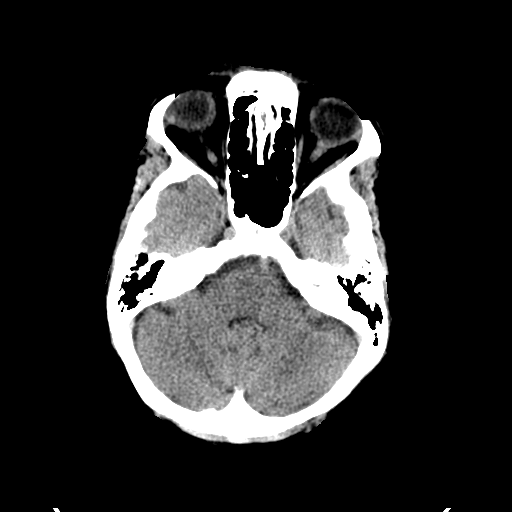
[im 10/32  brain]
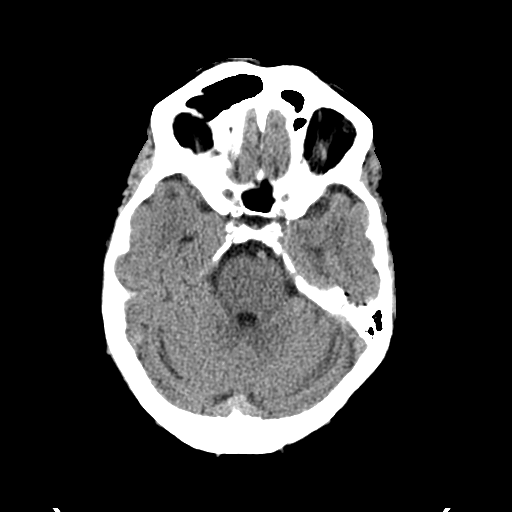
[im 10/32  bone]
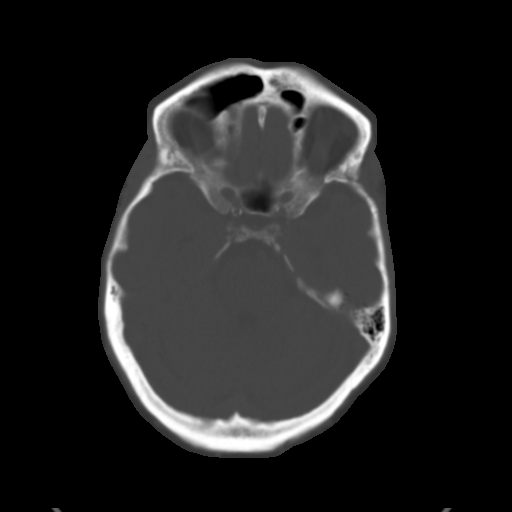
[im 12/32  brain]
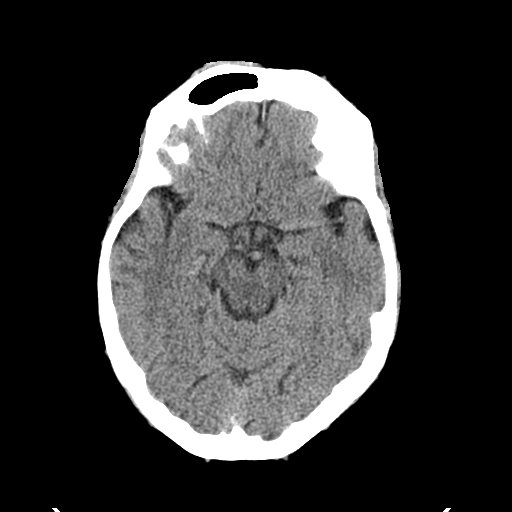
[im 14/32  brain]
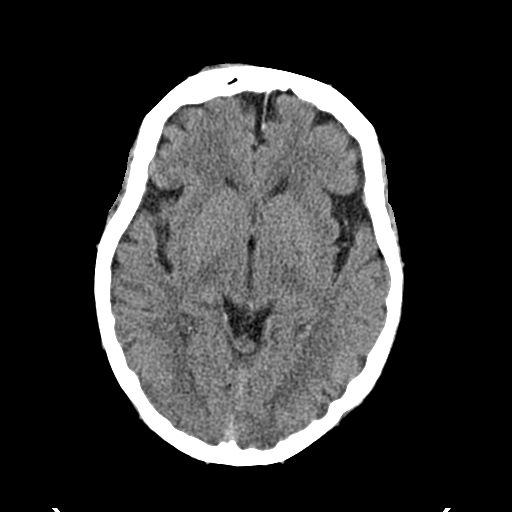
[im 17/32  brain]
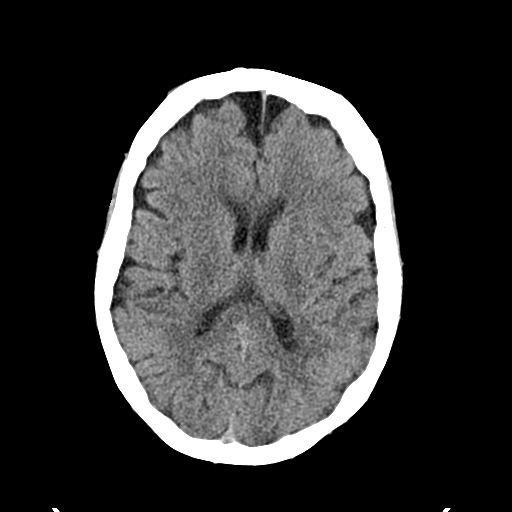
[im 18/32  brain]
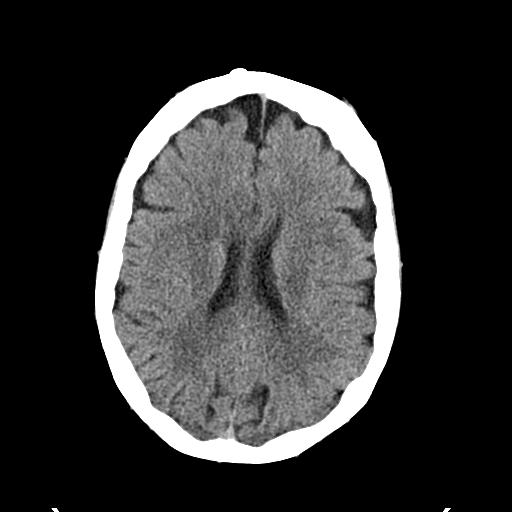
[im 18/32  bone]
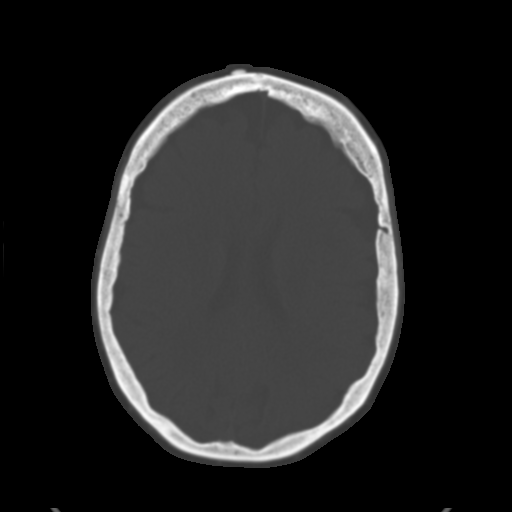
[im 20/32  brain]
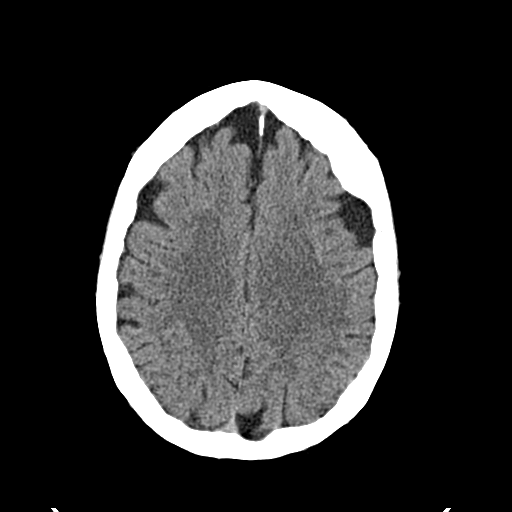
[im 22/32  brain]
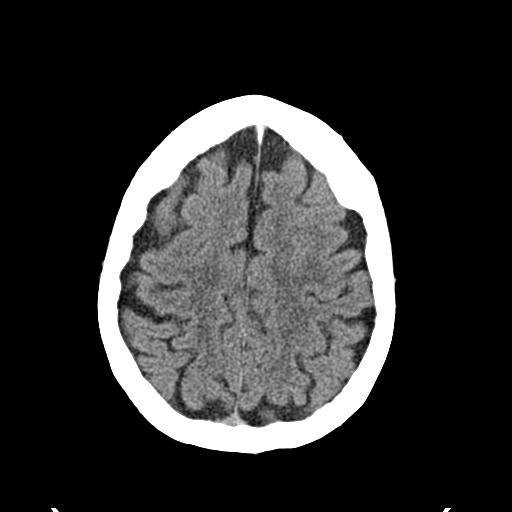
[im 24/32  brain]
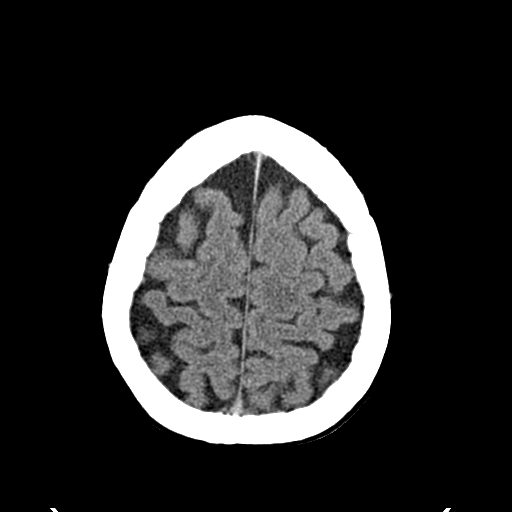
[im 26/32  brain]
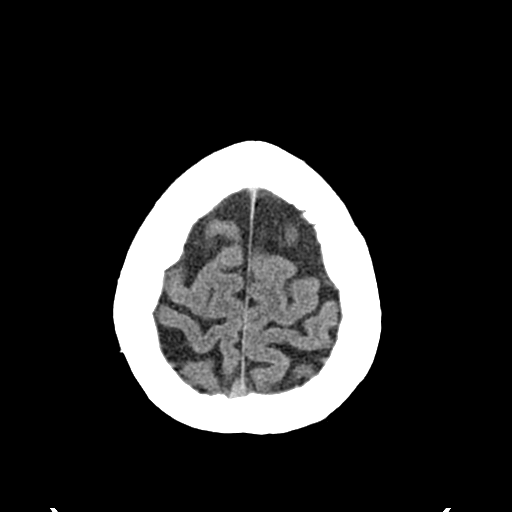
[im 26/32  bone]
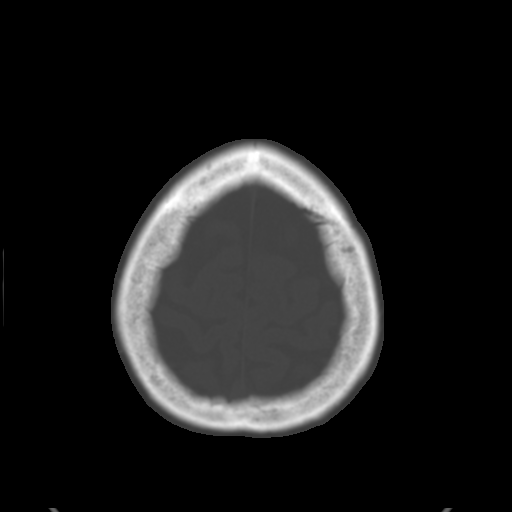
[im 28/32  brain]
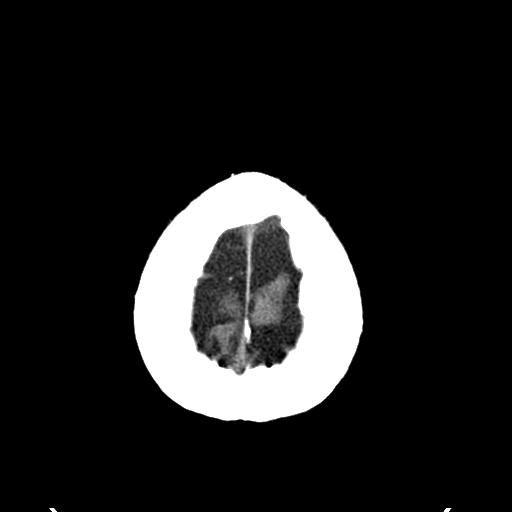
[im 30/32  brain]
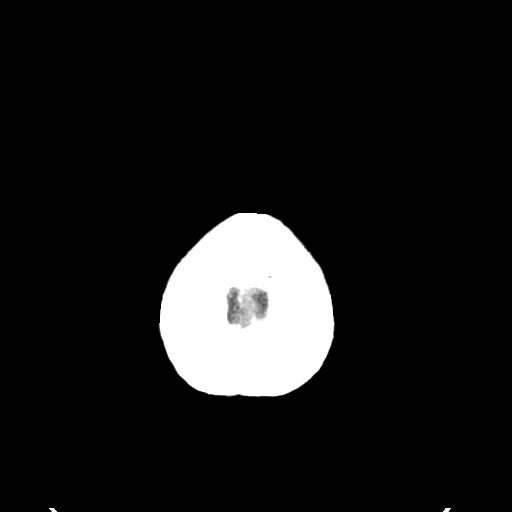

[15 of 30 positions shown; findings below may reference images not displayed]

FINDINGS: There is soft tissue swelling in the right frontal scalp.
There is no evidence of acute intracranial hemorrhage, mass lesion,
brain edema or extra-axial fluid collection.  The ventricles and
subarachnoid spaces are appropriately sized for age.  There is no
CT evidence of acute cortical infarction.

The visualized paranasal sinuses, mastoid air cells and middle ears
are clear. Facial findings are described below.  There is no
evidence of calvarial fracture.
IMPRESSION: No acute intracranial or calvarial findings.

CT MAXILLOFACIAL
FINDINGS: There are nondisplaced bilateral nasal bone fractures.
No other facial fractures are identified.  The mandible and
temporomandibular joints are intact.

The paranasal sinuses are clear without air fluid levels.  No
orbital hematoma is identified.  There is a focal supraorbital
hematoma in the right frontal scalp.  Degenerative changes are
noted within the upper cervical spine.
IMPRESSION: 1.  Bilateral nondisplaced nasal bone fractures.
2.  No other facial fractures are identified.
3.  Right supraorbital soft tissue swelling/hematoma.

## 2017-05-22 MED ORDER — ASPIRIN EC 81 MG PO TBEC
81.00 | DELAYED_RELEASE_TABLET | ORAL | Status: DC
Start: 2017-05-23 — End: 2017-05-22

## 2017-05-22 MED ORDER — CLOPIDOGREL BISULFATE 75 MG PO TABS
75.00 | ORAL_TABLET | ORAL | Status: DC
Start: 2017-05-23 — End: 2017-05-22

## 2017-05-22 MED ORDER — PANTOPRAZOLE SODIUM 40 MG PO TBEC
40.00 | DELAYED_RELEASE_TABLET | ORAL | Status: DC
Start: 2017-05-23 — End: 2017-05-22

## 2017-05-22 MED ORDER — GLUCOSE 40 % PO GEL
15.00 g | ORAL | Status: DC
Start: ? — End: 2017-05-22

## 2017-05-22 MED ORDER — GENERIC EXTERNAL MEDICATION
1.00 | Status: DC
Start: ? — End: 2017-05-22

## 2017-05-22 MED ORDER — FAMOTIDINE 20 MG PO TABS
40.00 | ORAL_TABLET | ORAL | Status: DC
Start: 2017-05-22 — End: 2017-05-22

## 2017-05-22 MED ORDER — ENOXAPARIN SODIUM 40 MG/0.4ML ~~LOC~~ SOLN
40.00 | SUBCUTANEOUS | Status: DC
Start: 2017-05-23 — End: 2017-05-22

## 2017-05-22 MED ORDER — CHOLECALCIFEROL 25 MCG (1000 UT) PO TABS
1000.00 | ORAL_TABLET | ORAL | Status: DC
Start: 2017-05-23 — End: 2017-05-22

## 2017-05-22 MED ORDER — DEXTROSE 50 % IV SOLN
12.00 g | INTRAVENOUS | Status: DC
Start: ? — End: 2017-05-22

## 2017-05-22 MED ORDER — ATORVASTATIN CALCIUM 40 MG PO TABS
80.00 | ORAL_TABLET | ORAL | Status: DC
Start: 2017-05-23 — End: 2017-05-22

## 2017-05-22 MED ORDER — INSULIN LISPRO 100 UNIT/ML ~~LOC~~ SOLN
1.00 | SUBCUTANEOUS | Status: DC
Start: 2017-05-23 — End: 2017-05-22

## 2022-04-17 DEATH — deceased
